# Patient Record
Sex: Female | Born: 1985 | Race: White | Hispanic: No | Marital: Married | Smoking: Former smoker
Health system: Southern US, Community
[De-identification: ages and names within clinical notes are randomized; demographics above are authoritative.]

---

## 2006-10-23 ENCOUNTER — Emergency Department: Payer: Self-pay | Admitting: Emergency Medicine

## 2006-10-23 ENCOUNTER — Other Ambulatory Visit: Payer: Self-pay

## 2010-10-27 ENCOUNTER — Inpatient Hospital Stay: Payer: Self-pay

## 2016-04-13 ENCOUNTER — Emergency Department: Payer: Self-pay

## 2016-04-13 ENCOUNTER — Emergency Department
Admission: EM | Admit: 2016-04-13 | Discharge: 2016-04-13 | Disposition: A | Discharge status: Home / Self Care | Payer: Self-pay | Attending: Emergency Medicine | Admitting: Emergency Medicine

## 2016-04-13 DIAGNOSIS — Z87891 Personal history of nicotine dependence: Secondary | ICD-10-CM | POA: Insufficient documentation

## 2016-04-13 DIAGNOSIS — Z79899 Other long term (current) drug therapy: Secondary | ICD-10-CM | POA: Insufficient documentation

## 2016-04-13 DIAGNOSIS — N939 Abnormal uterine and vaginal bleeding, unspecified: Secondary | ICD-10-CM

## 2016-04-13 DIAGNOSIS — Z3A08 8 weeks gestation of pregnancy: Secondary | ICD-10-CM | POA: Insufficient documentation

## 2016-04-13 DIAGNOSIS — O200 Threatened abortion: Secondary | ICD-10-CM | POA: Insufficient documentation

## 2016-04-13 LAB — COMPREHENSIVE METABOLIC PANEL
ALBUMIN: 4.1 g/dL (ref 3.5–5.0)
ALK PHOS: 66 U/L (ref 38–126)
ALT: 76 U/L — AB (ref 14–54)
AST: 58 U/L — AB (ref 15–41)
Anion gap: 8 (ref 5–15)
BUN: 9 mg/dL (ref 6–20)
CHLORIDE: 107 mmol/L (ref 101–111)
CO2: 21 mmol/L — AB (ref 22–32)
CREATININE: 0.7 mg/dL (ref 0.44–1.00)
Calcium: 8.7 mg/dL — ABNORMAL LOW (ref 8.9–10.3)
GFR calc non Af Amer: >60 mL/min (ref >60)
GLUCOSE: 107 mg/dL — AB (ref 65–99)
Potassium: 3.4 mmol/L — ABNORMAL LOW (ref 3.5–5.1)
SODIUM: 136 mmol/L (ref 135–145)
Total Bilirubin: 0.5 mg/dL (ref 0.3–1.2)
Total Protein: 7.3 g/dL (ref 6.5–8.1)

## 2016-04-13 LAB — ABO/RH: ABO/RH(D): O POS

## 2016-04-13 LAB — CBC WITH DIFFERENTIAL/PLATELET
Basophils Absolute: 0 10*3/uL (ref 0–0.1)
Basophils Relative: 0 %
EOS ABS: 0 10*3/uL (ref 0–0.7)
Eosinophils Relative: 0 %
HEMATOCRIT: 39 % (ref 35.0–47.0)
HEMOGLOBIN: 13.7 g/dL (ref 12.0–16.0)
LYMPHS ABS: 1.3 10*3/uL (ref 1.0–3.6)
Lymphocytes Relative: 23 %
MCH: 30.6 pg (ref 26.0–34.0)
MCHC: 35.1 g/dL (ref 32.0–36.0)
MCV: 86.9 fL (ref 80.0–100.0)
MONO ABS: 0.4 10*3/uL (ref 0.2–0.9)
MONOS PCT: 8 %
NEUTROS PCT: 69 %
Neutro Abs: 3.7 10*3/uL (ref 1.4–6.5)
Platelets: 222 10*3/uL (ref 150–440)
RBC: 4.49 MIL/uL (ref 3.80–5.20)
RDW: 12.1 % (ref 11.5–14.5)
WBC: 5.4 10*3/uL (ref 3.6–11.0)

## 2016-04-13 LAB — HCG, QUANTITATIVE, PREGNANCY: HCG, BETA CHAIN, QUANT, S: 108994 m[IU]/mL — AB (ref <5)

## 2016-04-13 NOTE — ED Notes (Signed)
Patient transported to Ultrasound 

## 2016-04-13 NOTE — ED Provider Notes (Signed)
[facility] Emergency Department Provider Note   ____________________________________________   First MD Initiated Contact with Patient 04/13/16 0920     (approximate)  I have reviewed the triage vital signs and the nursing notes.   HISTORY  Chief Complaint Vaginal Bleeding    HPI Kathryn Lowe is a 30 y.o. female who reports she is about [redacted] weeks pregnant. She has been spotting for the last couple days worse today. There is no pain associated with it. This is her second pregnancy she had no spotting with the first. She says she is slightly lightheaded but not much. Blood was dripping into the commode earlier.   History reviewed. No pertinent past medical history.  There are no active problems to display for this patient.   History reviewed. No pertinent surgical history.  Prior to Admission medications   Medication Sig Start Date End Date Taking? Authorizing Provider  Prenatal Vit-Fe Fumarate-FA (PRENATAL MULTIVITAMIN) TABS tablet Take 1 tablet by mouth daily at 12 noon.   Yes Historical Provider, MD    Allergies Patient has no known allergies.  No family history on file.  Social History Social History  Substance Use Topics  . Smoking status: Former Games developermoker  . Smokeless tobacco: Never Used  . Alcohol use No    Review of Systems Constitutional: No fever/chills Eyes: No visual changes. ENT: No sore throat. Cardiovascular: Denies chest pain. Respiratory: Denies shortness of breath. Gastrointestinal: No abdominal pain.  No nausea, no vomiting.  No diarrhea.  No constipation. Genitourinary: Negative for dysuria. Musculoskeletal: Negative for back pain. Skin: Negative for rash.  10-point ROS otherwise negative.  ____________________________________________   PHYSICAL EXAM:  VITAL SIGNS: ED Triage Vitals [04/13/16 0913]  Enc Vitals Group     BP 126/68     Pulse Rate (!) 109     Resp 18     Temp 98.4 F (36.9 C)     Temp  Source Oral     SpO2 100 %     Weight 140 lb (63.5 kg)     Height 5\' 6"  (1.676 m)     Head Circumference      Peak Flow      Pain Score 0     Pain Loc      Pain Edu?      Excl. in GC?     Constitutional: Alert and oriented.Patient reports she is very anxious and is actually shaking. Eyes: Conjunctivae are normal. PERRL. EOMI. Head: Atraumatic. Nose: No congestion/rhinnorhea. Mouth/Throat: Mucous membranes are moist.  Oropharynx non-erythematous. Neck: No stridor. Cardiovascular: Normal rate, regular rhythm. Grossly normal heart sounds.  Good peripheral circulation. Respiratory: Normal respiratory effort.  No retractions. Lungs CTAB. Gastrointestinal: Soft and nontender. No distention. No abdominal bruits. No CVA tenderness. {**Genitourinary:  Musculoskeletal: No lower extremity tenderness nor edema.  No joint effusions. Neurologic:  Normal speech and language. No gross focal neurologic deficits are appreciated. No gait instability. Skin:  Skin is warm, dry and intact. No rash noted.   ____________________________________________   LABS (all labs ordered are listed, but only abnormal results are displayed)  Labs Reviewed  COMPREHENSIVE METABOLIC PANEL - Abnormal; Notable for the following:       Result Value   Potassium 3.4 (*)    CO2 21 (*)    Glucose, Bld 107 (*)    Calcium 8.7 (*)    AST 58 (*)    ALT 76 (*)    All other components within normal limits  HCG,  QUANTITATIVE, PREGNANCY - Abnormal; Notable for the following:    hCG, Beta Chain, Quant, [place] [phone] (*)    All other components within normal limits  CBC WITH DIFFERENTIAL/PLATELET  ABO/RH   ____________________________________________  EKG   ____________________________________________  RADIOLOGY  Study Result   CLINICAL DATA:  First trimester pregnancy with increasing vaginal bleeding. LMP 02/04/2016. Beta HCG level 108,994.  EXAM: OBSTETRIC <14 WK US AND TRANSVAGINAL OB US  TECHNIQUE: Both  transabdominal and transvaginal ultrasound examinations were performed for complete evaluation of the gestation as well as the maternal uterus, adnexal regions, and pelvic cul-de-sac. Transvaginal technique was performed to assess early pregnancy.  COMPARISON:  None.  FINDINGS: Intrauterine gestational sac: Visualized/normal in shape.  Yolk sac:  Visualized.  Embryo:  Visualized.  Cardiac Activity: Visualized.  Heart Rate: 162  bpm  CRL:  15.1  mm; 7 w 6 d;                  US EDC: 11/24/2016  Subchorionic hemorrhage: None. Unfused amnion noted with some surrounding echogenic debris.  Maternal uterus/adnexae: Both maternal ovaries are visualized. Corpus luteum noted on the right. No adnexal mass or significant free pelvic fluid.  IMPRESSION: 1. Single live intrauterine pregnancy with best estimated gestational age of [redacted] weeks 6 days. 2. Unfused amnion with some surrounding echogenic debris, but no significant subchorionic hematoma.   Electronically Signed   By: Carey BullocksWilliam  Veazey M.D.   On: 04/13/2016 12:15     ____________________________________________   PROCEDURES  Procedure(s) performed:   Procedures  Critical Care performed:   ____________________________________________   INITIAL IMPRESSION / ASSESSMENT AND PLAN / ED COURSE  Pertinent labs & imaging results that were available during my care of the patient were reviewed by me and considered in my medical decision making (see chart for details).    Clinical Course   Patient declines pelvic exam. She is not having any pain ultrasound looks good and she will follow-up in the next couple days she says she will get GYN to do that.   ____________________________________________   FINAL CLINICAL IMPRESSION(S) / ED DIAGNOSES  Final diagnoses:  Threatened miscarriage      NEW MEDICATIONS STARTED DURING THIS VISIT:  New Prescriptions   No medications on file     Note:  This  document was prepared using Dragon voice recognition software and may include unintentional dictation errors.    Arnaldo NatalPaul F Malinda, MD 04/13/16 [phone]

## 2016-04-13 NOTE — ED Notes (Signed)
Patient reports some spotting. Bleeding heavy yesterday, last night stained underwear. Not as heavy today. No cramps or clots.

## 2016-04-13 NOTE — ED Notes (Signed)
Pt informed to return if any life threatening symptoms occur.  

## 2016-04-13 NOTE — ED Triage Notes (Signed)
Pt states she is [redacted]weeks pregnant and has been spotting for the past few days , denies pain

## 2016-04-13 NOTE — Discharge Instructions (Signed)
Please return for bad cramping or heavy bleeding or if you get lightheaded. Please call either [facility] or Dr[facility]les[facility]ard [facility] Let them know you were in the ER with bleeding and her [redacted] weeks pregnant. They will likely see within the next 2 days for follow-up.

## 2016-05-07 ENCOUNTER — Emergency Department
Admission: EM | Admit: 2016-05-07 | Discharge: 2016-05-07 | Disposition: A | Discharge status: Home / Self Care | Payer: Self-pay | Attending: Emergency Medicine | Admitting: Emergency Medicine

## 2016-05-07 ENCOUNTER — Encounter: Payer: Self-pay | Admitting: Emergency Medicine

## 2016-05-07 DIAGNOSIS — Z87891 Personal history of nicotine dependence: Secondary | ICD-10-CM | POA: Insufficient documentation

## 2016-05-07 DIAGNOSIS — Z3A11 11 weeks gestation of pregnancy: Secondary | ICD-10-CM | POA: Insufficient documentation

## 2016-05-07 DIAGNOSIS — O200 Threatened abortion: Secondary | ICD-10-CM | POA: Insufficient documentation

## 2016-05-07 DIAGNOSIS — Z79899 Other long term (current) drug therapy: Secondary | ICD-10-CM | POA: Insufficient documentation

## 2016-05-07 LAB — CBC WITH DIFFERENTIAL/PLATELET
BASOS ABS: 0 10*3/uL (ref 0–0.1)
Basophils Relative: 1 %
EOS PCT: 1 %
Eosinophils Absolute: 0 10*3/uL (ref 0–0.7)
HCT: 40.4 % (ref 35.0–47.0)
Hemoglobin: 14.2 g/dL (ref 12.0–16.0)
LYMPHS PCT: 18 %
Lymphs Abs: 1.6 10*3/uL (ref 1.0–3.6)
MCH: 30.5 pg (ref 26.0–34.0)
MCHC: 35.2 g/dL (ref 32.0–36.0)
MCV: 86.7 fL (ref 80.0–100.0)
Monocytes Absolute: 0.6 10*3/uL (ref 0.2–0.9)
Monocytes Relative: 7 %
NEUTROS ABS: 6.9 10*3/uL — AB (ref 1.4–6.5)
NEUTROS PCT: 75 %
PLATELETS: 249 10*3/uL (ref 150–440)
RBC: 4.66 MIL/uL (ref 3.80–5.20)
RDW: 11.7 % (ref 11.5–14.5)
WBC: 9.2 10*3/uL (ref 3.6–11.0)

## 2016-05-07 LAB — HCG, QUANTITATIVE, PREGNANCY: HCG, BETA CHAIN, QUANT, S: 55441 m[IU]/mL — AB (ref <5)

## 2016-05-07 NOTE — ED Provider Notes (Signed)
[facility] Emergency Department Provider Note  ____________________________________________   I have reviewed the triage vital signs and the nursing notes.   HISTORY  Chief Complaint Abdominal Pain and Vaginal Bleeding    HPI Kathryn Lowe is a 30 y.o. female with a known IUP who is G2 P1, has a prior ultrasound which shows an intrauterine pregnancy, presents today with increased bleeding. About the level of.. Starts to having some cramping as well. She is a partially [redacted] weeks pregnant. Patient here for that reason. She wants to see if she is having a miscarriage. She denies any clots or passage of tissue and she is not having significant pain at this time.  She is not lightheaded and has no history of easy bleeding.  History reviewed. No pertinent past medical history.  There are no active problems to display for this patient.   History reviewed. No pertinent surgical history.  Prior to Admission medications   Medication Sig Start Date End Date Taking? Authorizing Provider  Prenatal Vit-Fe Fumarate-FA (PRENATAL MULTIVITAMIN) TABS tablet Take 1 tablet by mouth daily at 12 noon.   Yes Historical Provider, MD    Allergies Patient has no known allergies.  No family history on file.  Social History Social History  Substance Use Topics  . Smoking status: Former Games developermoker  . Smokeless tobacco: Never Used  . Alcohol use No    Review of Systems }Constitutional: No fever/chills Eyes: No visual changes. ENT: No sore throat. No stiff neck no neck pain Cardiovascular: Denies chest pain. Respiratory: Denies shortness of breath. Gastrointestinal:   no vomiting.  No diarrhea.  No constipation. Genitourinary: Negative for dysuria. Musculoskeletal: Negative lower extremity swelling Skin: Negative for rash. Neurological: Negative for severe headaches, focal weakness or numbness. 10-point ROS otherwise  negative.  ____________________________________________   PHYSICAL EXAM:  VITAL SIGNS: ED Triage Vitals  Enc Vitals Group     BP 05/07/16 0758 125/71     Pulse Rate 05/07/16 0758 83     Resp 05/07/16 0758 18     Temp 05/07/16 0758 97.6 F (36.4 C)     Temp Source 05/07/16 0758 Oral     SpO2 05/07/16 0758 99 %     Weight 05/07/16 0758 140 lb (63.5 kg)     Height 05/07/16 0758 5\' 6"  (1.676 m)     Head Circumference --      Peak Flow --      Pain Score 05/07/16 0759 5     Pain Loc --      Pain Edu? --      Excl. in GC? --     Constitutional: Alert and oriented. Well appearing and in no acute distress. Eyes: Conjunctivae are normal. PERRL. EOMI. Head: Atraumatic. Nose: No congestion/rhinnorhea. Mouth/Throat: Mucous membranes are moist.  Oropharynx non-erythematous. Neck: No stridor.   Nontender with no meningismus Cardiovascular: Normal rate, regular rhythm. Grossly normal heart sounds.  Good peripheral circulation. Respiratory: Normal respiratory effort.  No retractions. Lungs CTAB. Abdominal: Soft and nontender. No distention. No guarding no rebound Back:  There is no focal tenderness or step off.  there is no midline tenderness there are no lesions noted. there is no CVA tenderness GU: Patient declined Musculoskeletal: No lower extremity tenderness, no upper extremity tenderness. No joint effusions, no DVT signs strong distal pulses no edema Neurologic:  Normal speech and language. No gross focal neurologic deficits are appreciated.  Skin:  Skin is warm, dry and intact. No rash noted. Psychiatric: Mood  and affect are normal. Speech and behavior are normal.  ____________________________________________   LABS (all labs ordered are listed, but only abnormal results are displayed)  Labs Reviewed  HCG, QUANTITATIVE, PREGNANCY - Abnormal; Notable for the following:       Result Value   hCG, Beta Chain, Quant, S 55,441 (*)    All other components within normal limits   CBC WITH DIFFERENTIAL/PLATELET - Abnormal; Notable for the following:    Neutro Abs 6.9 (*)    All other components within normal limits   ____________________________________________  EKG  I personally interpreted any EKGs ordered by me or triage  ____________________________________________  RADIOLOGY  I reviewed any imaging ordered by me or triage that were performed during my shift and, if possible, patient and/or family made aware of any abnormal findings. ____________________________________________   PROCEDURES  Procedure(s) performed: None  Procedures  Critical Care performed: None  ____________________________________________   INITIAL IMPRESSION / ASSESSMENT AND PLAN / ED COURSE  Pertinent labs & imaging results that were available during my care of the patient were reviewed by me and considered in my medical decision making (see chart for details).  Patient here with menstrual period level bleeding in early pregnancy. She is known oh positive, she has known IUP, her abdomen is benign. I am unable to find fetal heart tones. I did explain to her the likelihood of a miscarriage. I discussed with Dr. Jean RosenthalJackson, who would prefer having an ultrasound if possible, I talked to the family and they at this time do not wish to do so. They are self pay, and is certainly the case and I'll show is not likely to radically change management at this moment. They would therefore prefer to defer ultrasound. They know that this means I cannot tell them the status of the pregnancy and they're very comfortable with the limitations is voices upon them. I did attempt bedside ultrasound I do not see any definitive IUP, but they understand I'm not an expert at this and in order to know we have to do a regular ultrasound. In addition, patient at this time states that she would prefer not to have a pelvic exam she would rather just go home. Again this is certainly her choice but it limits my ability  to evaluate her condition and she understands this. Extensive return precautions and follow-up given and understood. Patient is to follow-up as an outpatient with a provider and I have called the social worker that is working with her to see if they can advance that appointment. I'll also refer her to Dr. Jean RosenthalJackson. Patient very comfortable with this plan. Patient and husband both understand the risks benefits and alternative declining ultrasound and declining pelvic exam. Both of them understand return precautions.  Clinical Course    ____________________________________________   FINAL CLINICAL IMPRESSION(S) / ED DIAGNOSES  Final diagnoses:  None      This chart was dictated using voice recognition software.  Despite best efforts to proofread,  errors can occur which can change meaning.     Jeanmarie PlantJames A Remmy Crass, MD 05/07/16 1025

## 2016-05-07 NOTE — ED Notes (Signed)
Reports being [redacted] wks pregnant. Spotting throughout pregnancy.  Last night started having heavier bleeding and cramping. Skin w/d with good color. Gravida 2, no issues with previous pregnancy.

## 2016-05-07 NOTE — ED Notes (Signed)
Upon standing to get dressed pt passed several clots.  Pt given clean garments.  Skin w/d with good color.  Pt tearful, husband supportive at bedside.

## 2016-05-07 NOTE — ED Triage Notes (Signed)
Pt states is 11 weeks 2 days pregnant, G2L1. Pt states was seen here 3 weeks ago for cramping and spotting. Pt states was told "everything was fine" when seen. Pt states approx 2 days ago began having light spotting, cramping started last night and has progressively worsened as well as spotting has progressed to being "like a period". Pt reports using 1 pad since last night.

## 2016-05-07 NOTE — ED Notes (Signed)
Pt states the IV is uncomfortable and request it be taken out.  IV removed, site intact.

## 2016-05-28 ENCOUNTER — Encounter: Payer: Self-pay | Admitting: Obstetrics and Gynecology

## 2017-03-18 ENCOUNTER — Encounter: Payer: Self-pay | Admitting: Emergency Medicine

## 2017-03-18 ENCOUNTER — Emergency Department: Payer: Self-pay

## 2017-03-18 ENCOUNTER — Emergency Department
Admission: EM | Admit: 2017-03-18 | Discharge: 2017-03-18 | Disposition: A | Discharge status: Home / Self Care | Payer: Self-pay | Attending: Emergency Medicine | Admitting: Emergency Medicine

## 2017-03-18 DIAGNOSIS — R7401 Elevation of levels of liver transaminase levels: Secondary | ICD-10-CM

## 2017-03-18 DIAGNOSIS — R740 Nonspecific elevation of levels of transaminase and lactic acid dehydrogenase [LDH]: Secondary | ICD-10-CM | POA: Insufficient documentation

## 2017-03-18 DIAGNOSIS — Z87891 Personal history of nicotine dependence: Secondary | ICD-10-CM | POA: Insufficient documentation

## 2017-03-18 DIAGNOSIS — I889 Nonspecific lymphadenitis, unspecified: Secondary | ICD-10-CM | POA: Insufficient documentation

## 2017-03-18 LAB — URINALYSIS, COMPLETE (UACMP) WITH MICROSCOPIC
BACTERIA UA: NONE SEEN
BILIRUBIN URINE: NEGATIVE
Glucose, UA: NEGATIVE mg/dL
Ketones, ur: NEGATIVE mg/dL
Leukocytes, UA: NEGATIVE
Nitrite: NEGATIVE
PROTEIN: NEGATIVE mg/dL
SPECIFIC GRAVITY, URINE: 1.004 — AB (ref 1.005–1.030)
pH: 7 (ref 5.0–8.0)

## 2017-03-18 LAB — CBC WITH DIFFERENTIAL/PLATELET
Basophils Absolute: 0 10*3/uL (ref 0–0.1)
Basophils Relative: 1 %
Eosinophils Absolute: 0 10*3/uL (ref 0–0.7)
Eosinophils Relative: 0 %
HEMATOCRIT: 36.9 % (ref 35.0–47.0)
HEMOGLOBIN: 12.4 g/dL (ref 12.0–16.0)
LYMPHS ABS: 0.6 10*3/uL — AB (ref 1.0–3.6)
LYMPHS PCT: 11 %
MCH: 28.3 pg (ref 26.0–34.0)
MCHC: 33.7 g/dL (ref 32.0–36.0)
MCV: 84 fL (ref 80.0–100.0)
MONOS PCT: 8 %
Monocytes Absolute: 0.5 10*3/uL (ref 0.2–0.9)
NEUTROS ABS: 4.9 10*3/uL (ref 1.4–6.5)
Neutrophils Relative %: 80 %
Platelets: 280 10*3/uL (ref 150–440)
RBC: 4.39 MIL/uL (ref 3.80–5.20)
RDW: 12.8 % (ref 11.5–14.5)
WBC: 6 10*3/uL (ref 3.6–11.0)

## 2017-03-18 LAB — LACTIC ACID, PLASMA: LACTIC ACID, VENOUS: 1.3 mmol/L (ref 0.5–1.9)

## 2017-03-18 LAB — COMPREHENSIVE METABOLIC PANEL
ALBUMIN: 3.6 g/dL (ref 3.5–5.0)
ALT: 156 U/L — ABNORMAL HIGH (ref 14–54)
ANION GAP: 11 (ref 5–15)
AST: 129 U/L — ABNORMAL HIGH (ref 15–41)
Alkaline Phosphatase: 285 U/L — ABNORMAL HIGH (ref 38–126)
BILIRUBIN TOTAL: 0.6 mg/dL (ref 0.3–1.2)
BUN: <5 mg/dL — ABNORMAL LOW (ref 6–20)
CHLORIDE: 102 mmol/L (ref 101–111)
CO2: 24 mmol/L (ref 22–32)
Calcium: 8.5 mg/dL — ABNORMAL LOW (ref 8.9–10.3)
Creatinine, Ser: 0.61 mg/dL (ref 0.44–1.00)
GFR calc Af Amer: >60 mL/min (ref >60)
GFR calc non Af Amer: >60 mL/min (ref >60)
Glucose, Bld: 102 mg/dL — ABNORMAL HIGH (ref 65–99)
POTASSIUM: 3.2 mmol/L — AB (ref 3.5–5.1)
Sodium: 137 mmol/L (ref 135–145)
TOTAL PROTEIN: 7.4 g/dL (ref 6.5–8.1)

## 2017-03-18 LAB — TSH: TSH: 2.39 u[IU]/mL (ref 0.350–4.500)

## 2017-03-18 LAB — MONONUCLEOSIS SCREEN: MONO SCREEN: NEGATIVE

## 2017-03-18 MED ORDER — OXYCODONE HCL 5 MG PO TABS: 5.0000 mg | ORAL_TABLET | Freq: Three times a day (TID) | ORAL | 0 refills | Status: AC | PRN

## 2017-03-18 MED ORDER — PREDNISONE 50 MG PO TABS: ORAL_TABLET | ORAL | 0 refills | Status: AC

## 2017-03-18 NOTE — ED Triage Notes (Signed)
Patient presents to the ED with swollen and tender neck and tender shoulders.  Patient reports pain with yawning.  Patient states she believes she has had an intermittent fever for the past several days and has felt very tired.  Patient denies pain with swallowing.

## 2017-03-18 NOTE — ED Notes (Signed)
Lumps noted bilaterally on throat/neck. Patient states tender to touch, more pain below region of lumps, painful to swallow and turn head left to right. Patient reports intermittent elevated temperature.

## 2017-03-18 NOTE — ED Provider Notes (Signed)
[facility] Emergency Department Provider Note       Time seen: ----------------------------------------- 7:42 PM on 03/18/2017 -----------------------------------------     I have reviewed the triage vital signs and the nursing notes.   HISTORY   Chief Complaint Neck Pain    HPI Shalla R Memory Argueance is a 31 y.o. female with no significant past medical history who presents to the ED for adenopathy in her neck. Patient presents to ER with swollen and tender region in her neck and shoulders. Patient reports the pain is worse with yawning, she noticed it over the weekend and has progressed until today. She denies any specific pain with swallowing, has had some malaise but no specific fevers.  History reviewed. No pertinent past medical history.  There are no active problems to display for this patient.   History reviewed. No pertinent surgical history.  Allergies Aspirin  Social History Social History  Substance Use Topics  . Smoking status: Former Games developermoker  . Smokeless tobacco: Never Used  . Alcohol use No    Review of Systems Constitutional: Negative for fever. Eyes: Negative for vision changes ENT:  Negative for congestion, sore throat. Positive for neck pain and swollen lymph nodes Cardiovascular: Negative for chest pain. Respiratory: Negative for shortness of breath. Gastrointestinal: Negative for abdominal pain, vomiting and diarrhea. Genitourinary: Negative for dysuria. Musculoskeletal: Negative for back pain. Skin: Negative for rash. Neurological: Negative for headaches, focal weakness or numbness.  All systems negative/normal/unremarkable except as stated in the HPI  ____________________________________________   PHYSICAL EXAM:  VITAL SIGNS: ED Triage Vitals  Enc Vitals Group     BP 03/18/17 1753 112/75     Pulse Rate 03/18/17 1753 (!) 144     Resp 03/18/17 1753 18     Temp 03/18/17 1753 99.5 F (37.5 C)     Temp Source  03/18/17 1753 Oral     SpO2 03/18/17 1753 96 %     Weight 03/18/17 1754 135 lb (61.2 kg)     Height 03/18/17 1754 5\' 6"  (1.676 m)     Head Circumference --      Peak Flow --      Pain Score 03/18/17 1807 5     Pain Loc --      Pain Edu? --      Excl. in GC? --     Constitutional: Alert and oriented. Well appearing and in no distress. Eyes: Conjunctivae are normal. Normal extraocular movements. ENT   Head: Normocephalic and atraumatic.   Nose: No congestion/rhinnorhea.   Mouth/Throat: Mucous membranes are moist.   Neck: No stridor. Marked anterior adenopathy Cardiovascular: Normal rate, regular rhythm. No murmurs, rubs, or gallops. Respiratory: Normal respiratory effort without tachypnea nor retractions. Breath sounds are clear and equal bilaterally. No wheezes/rales/rhonchi. Gastrointestinal: Soft and nontender. Normal bowel sounds. No hepatosplenomegaly Musculoskeletal: Nontender with normal range of motion in extremities. No lower extremity tenderness nor edema. Neurologic:  Normal speech and language. No gross focal neurologic deficits are appreciated.  Skin:  Skin is warm, dry and intact. No rash noted. Psychiatric: Mood and affect are normal. Speech and behavior are normal.  ____________________________________________  EKG: Interpreted by me. Sinus tachycardia with a rate of 145 bpm, normal PR interval, normal QRS, normal QT.  ____________________________________________  ED COURSE:  Pertinent labs & imaging results that were available during my care of the patient were reviewed by me and considered in my medical decision making (see chart for details). Patient presents for adenopathy, we will assess  with labs and imaging as indicated.   Procedures ____________________________________________   LABS (pertinent positives/negatives)  Labs Reviewed  COMPREHENSIVE METABOLIC PANEL - Abnormal; Notable for the following:       Result Value   Potassium 3.2 (*)     Glucose, Bld 102 (*)    BUN <5 (*)    Calcium 8.5 (*)    AST 129 (*)    ALT 156 (*)    Alkaline Phosphatase 285 (*)    All other components within normal limits  CBC WITH DIFFERENTIAL/PLATELET - Abnormal; Notable for the following:    Lymphs Abs 0.6 (*)    All other components within normal limits  URINALYSIS, COMPLETE (UACMP) WITH MICROSCOPIC - Abnormal; Notable for the following:    Color, Urine STRAW (*)    APPearance CLEAR (*)    Specific Gravity, Urine 1.004 (*)    Hgb urine dipstick MODERATE (*)    Squamous Epithelial / LPF 0-5 (*)    All other components within normal limits  LACTIC ACID, PLASMA  MONONUCLEOSIS SCREEN  TSH  LACTIC ACID, PLASMA  HEPATITIS PANEL, ACUTE    RADIOLOGY Images were viewed by me  Right upper quadrant ultrasound IMPRESSION: Cholelithiasis without sonographic evidence of acute cholecystitis. Mild gallbladder wall thickening likely due to partial decompression. ____________________________________________  DIFFERENTIAL DIAGNOSIS   Hepatitis, mononucleosis, viral syndrome, sepsis, biliary colic   FINAL ASSESSMENT AND PLAN  Cervical lymphadenopathy, transaminitis   Plan: Patient had presented for enlarged lymph nodes. Patients labs did reveal elevation in her liver function tests of uncertain etiology.. Patients imaging revealed gallstones but no other abnormality. Likely her symptoms are viral related. I will prescribe some pain medicine as well as steroids and she just needs close outpatient follow-up with her doctor. I did send off a hepatitis panel.   Emily Filbert, MD   Note: This note was generated in part or whole with voice recognition software. Voice recognition is usually quite accurate but there are transcription errors that can and very often do occur. I apologize for any typographical errors that were not detected and corrected.     Emily Filbert, MD 03/18/17 2104

## 2017-03-20 LAB — HEPATITIS PANEL, ACUTE
HEP A IGM: NEGATIVE
Hep B C IgM: NEGATIVE
Hepatitis B Surface Ag: NEGATIVE

## 2018-03-27 IMAGING — US US OB COMP LESS 14 WK
1 series · 14 of 28 positions shown · non-contrast
Comparison: None.

CLINICAL DATA: First trimester pregnancy with increasing vaginal
bleeding. LMP 02/04/2016. Beta HCG level [DATE].

EXAM:
OBSTETRIC <14 WK US AND TRANSVAGINAL OB US
TECHNIQUE: Both transabdominal and transvaginal ultrasound examinations were
performed for complete evaluation of the gestation as well as the
maternal uterus, adnexal regions, and pelvic cul-de-sac.
Transvaginal technique was performed to assess early pregnancy.

[Series 1: us ob comp less 14 wk · 0.14mm/px · 14 of 125 slices shown]
[im 5/125]
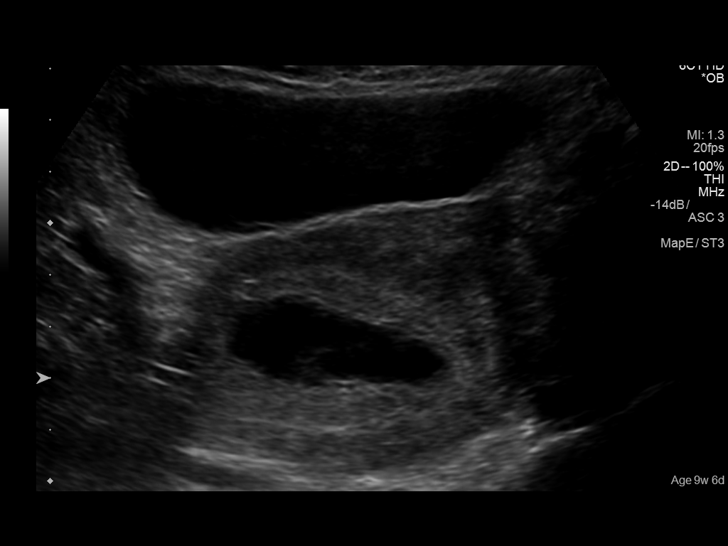
[im 14/125]
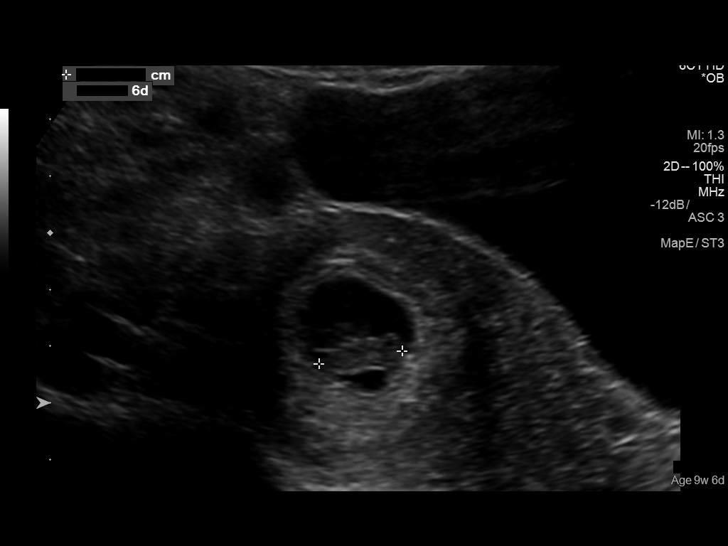
[im 23/125]
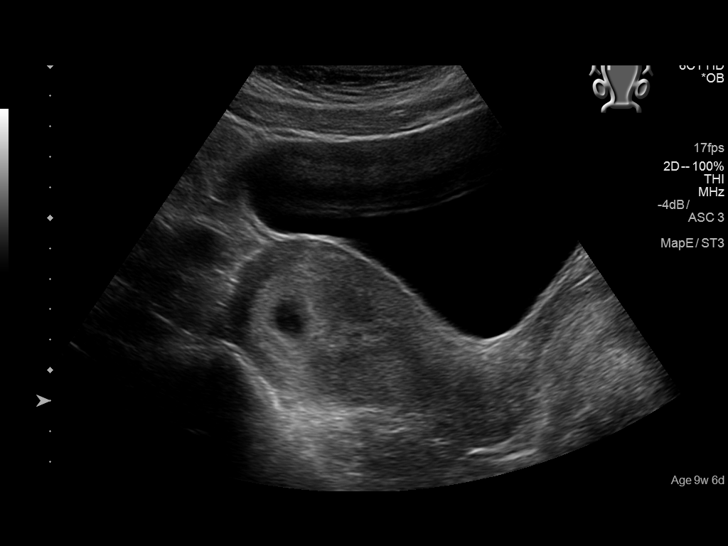
[im 33/125]
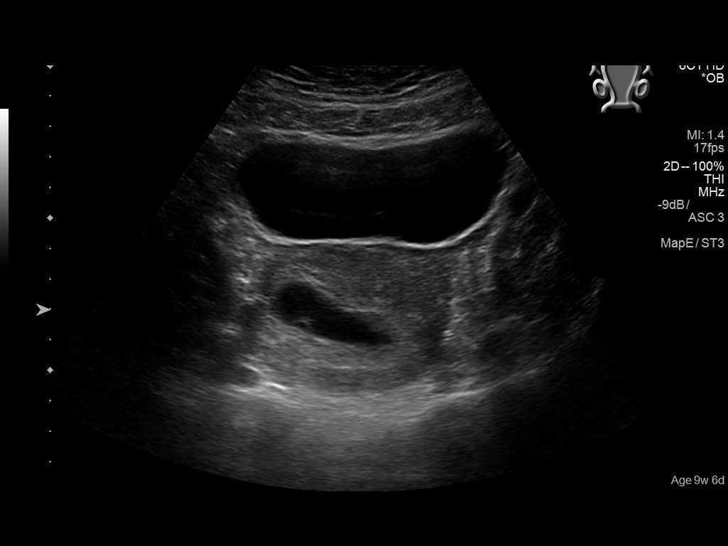
[im 42/125]
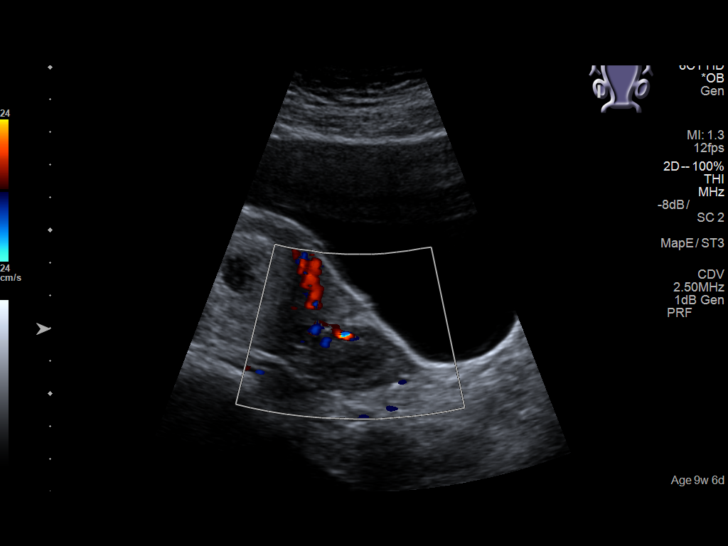
[im 51/125]
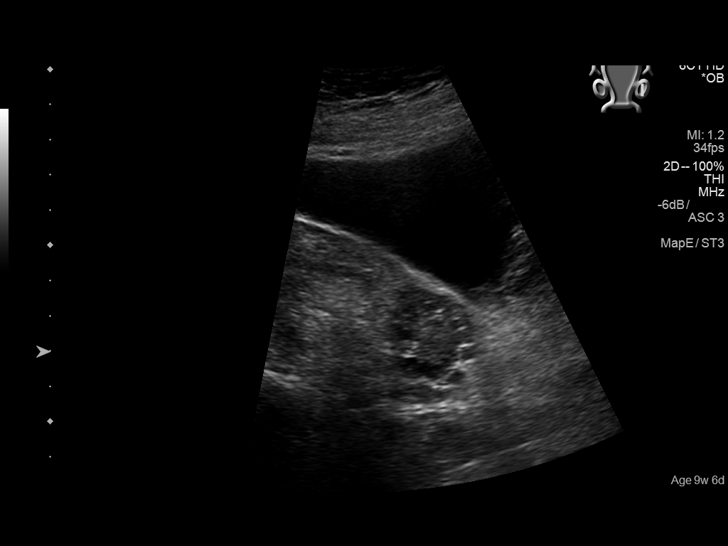
[im 60/125]
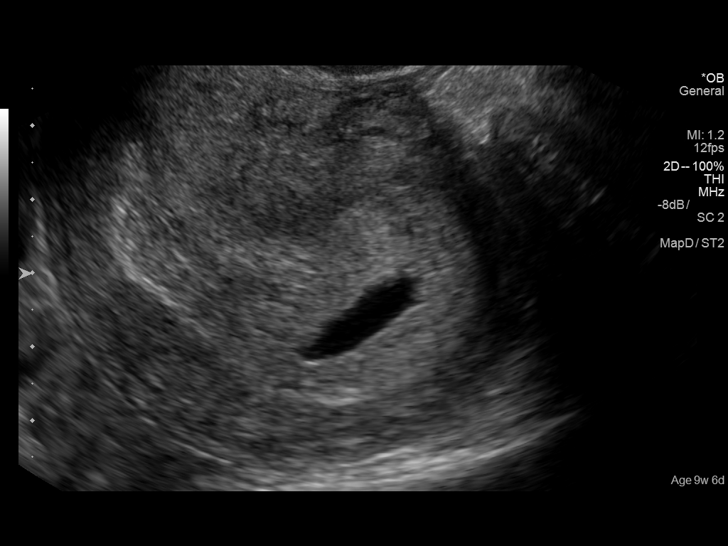
[im 69/125]
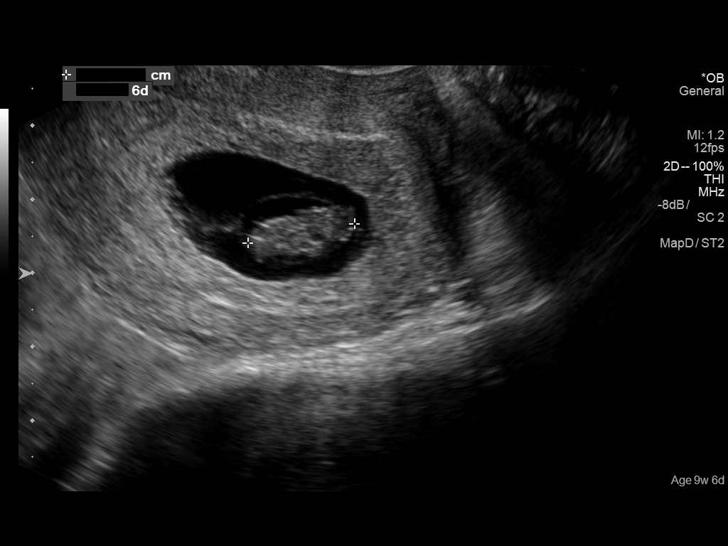
[im 79/125]
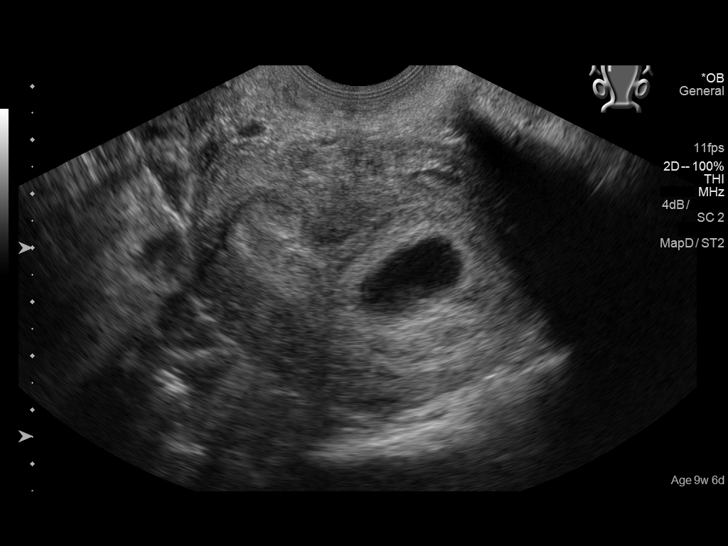
[im 88/125]
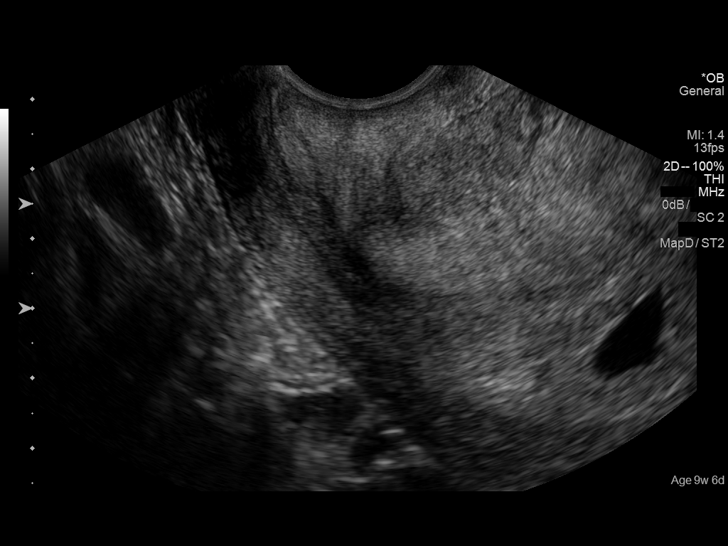
[im 97/125]
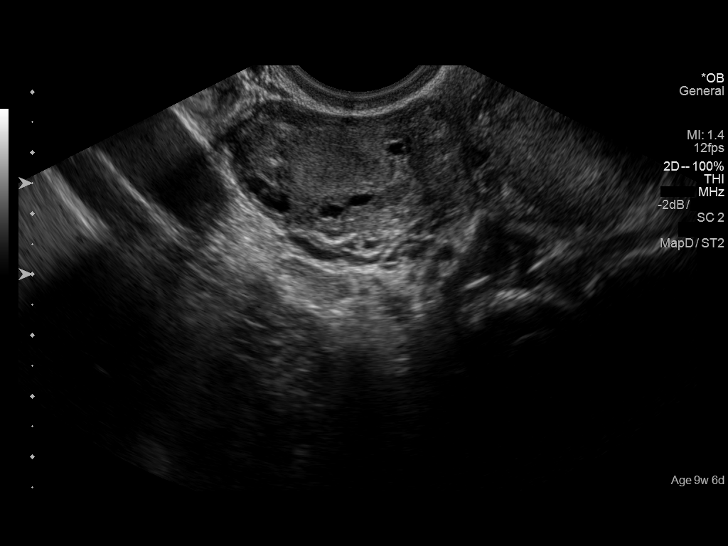
[im 106/125]
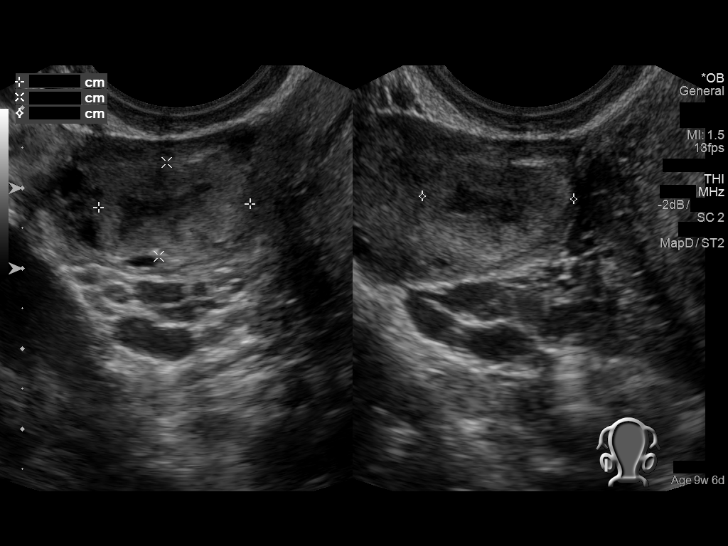
[im 115/125]
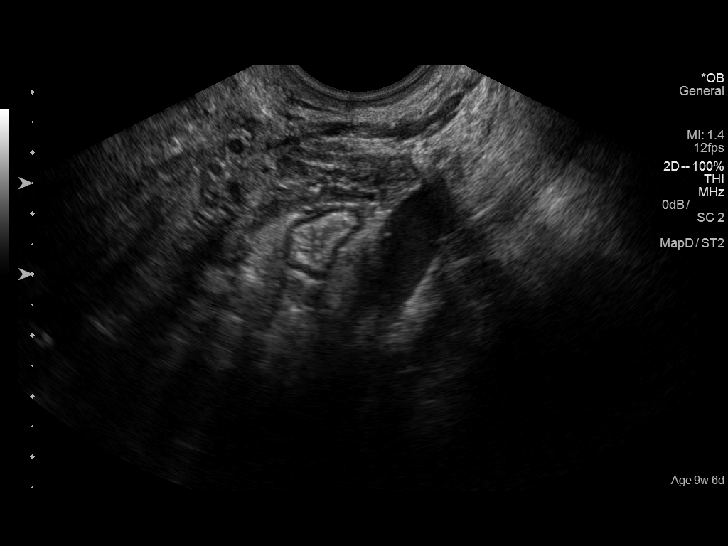
[im 125/125]
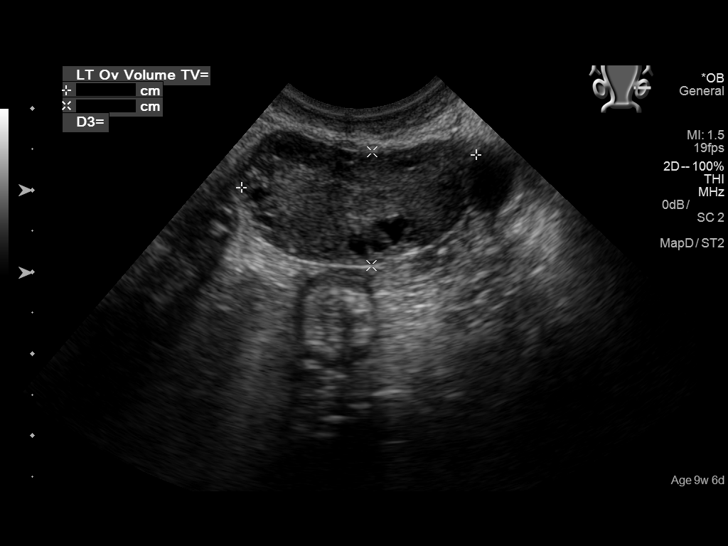

[14 of 28 positions shown; findings below may reference images not displayed]

FINDINGS: Intrauterine gestational sac: Visualized/normal in shape.

Yolk sac:  Visualized.

Embryo:  Visualized.

Cardiac Activity: Visualized.

Heart Rate: 162  bpm

CRL:  15.1  mm; 7 w 6 d;                  US EDC: 11/24/2016

Subchorionic hemorrhage: None. Unfused amnion noted with some
surrounding echogenic debris.

Maternal uterus/adnexae: Both maternal ovaries are visualized.
Corpus luteum noted on the right. No adnexal mass or significant
free pelvic fluid.
IMPRESSION: 1. Single live intrauterine pregnancy with best estimated
gestational age of 7 weeks 6 days.
2. Unfused amnion with some surrounding echogenic debris, but no
significant subchorionic hematoma.

## 2019-03-01 IMAGING — US US ABDOMEN LIMITED
1 series · 14 of 25 positions shown · non-contrast
Comparison: None.

CLINICAL DATA: Transaminitis.

EXAM:
ULTRASOUND ABDOMEN LIMITED RIGHT UPPER QUADRANT

[Series 1: us abdomen limited · 0.17mm/px · 14 of 44 slices shown]
[im 1/44]
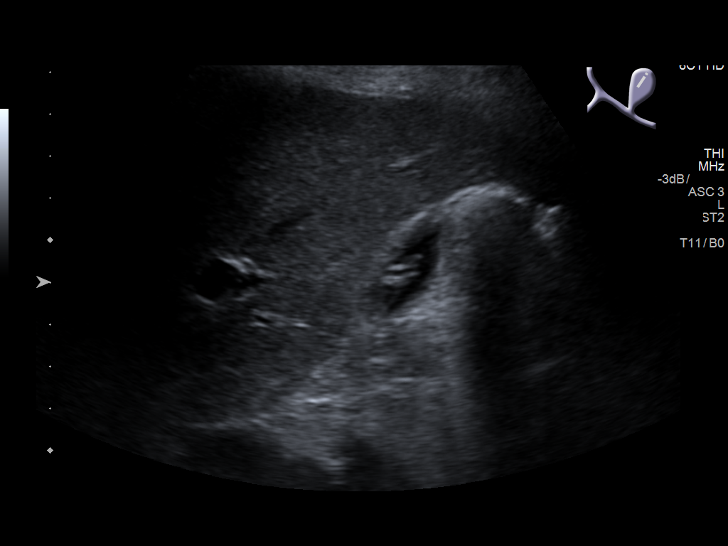
[im 4/44]
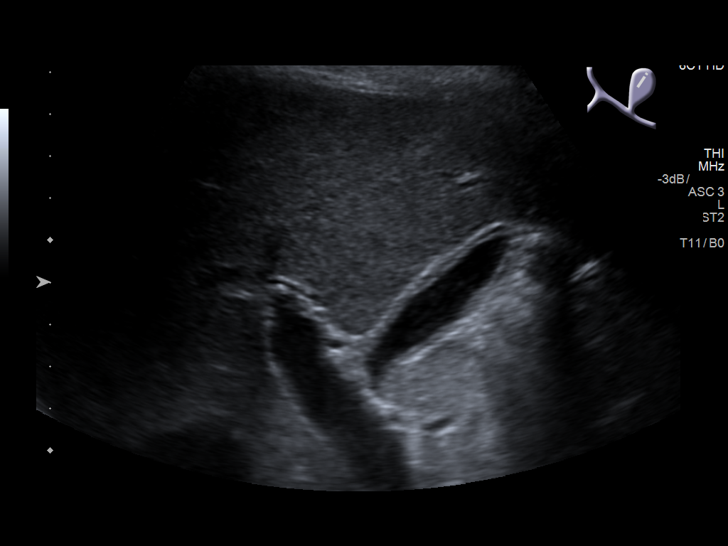
[im 8/44]
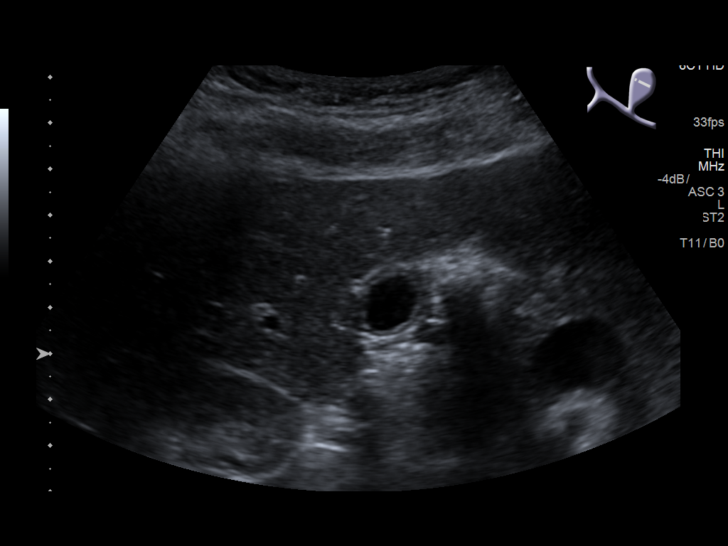
[im 11/44]
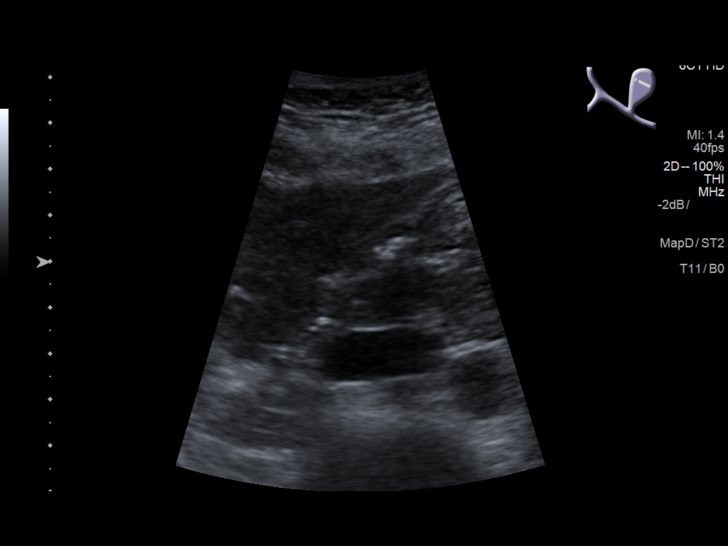
[im 15/44]
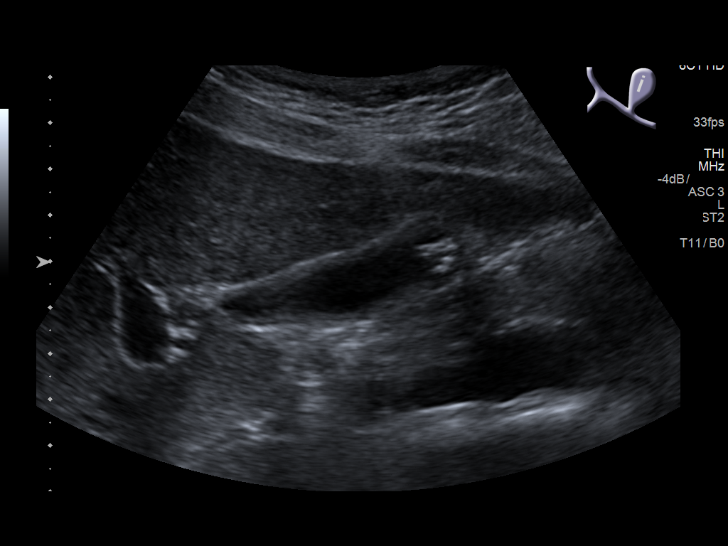
[im 17/44]
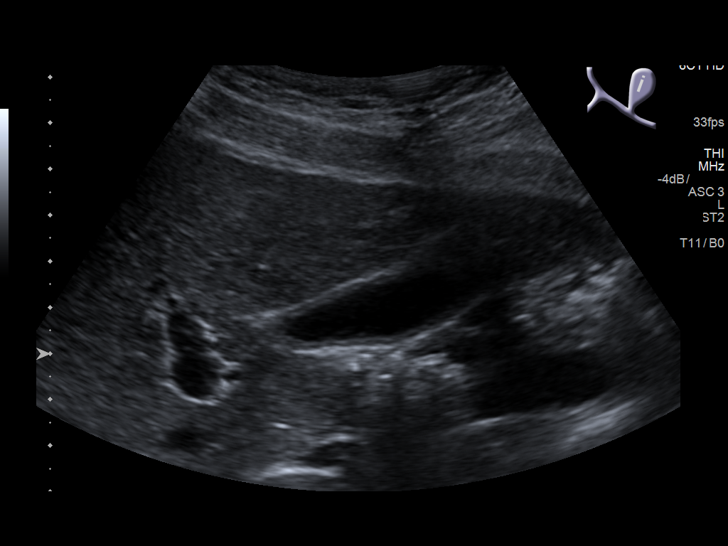
[im 20/44]
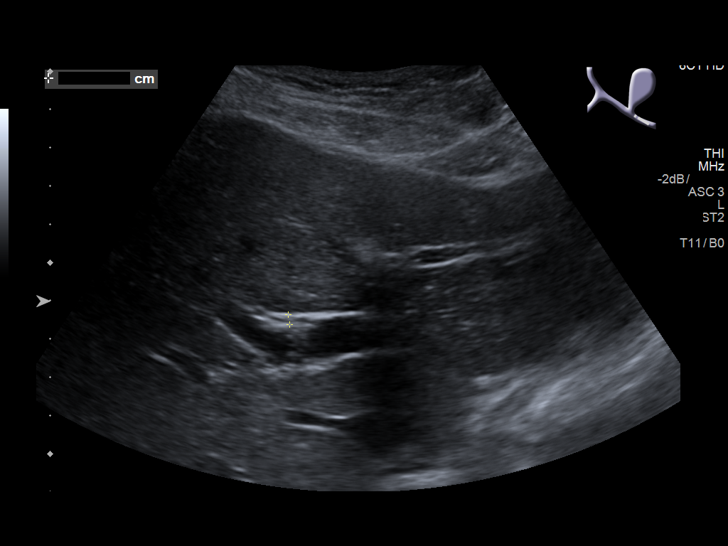
[im 24/44]
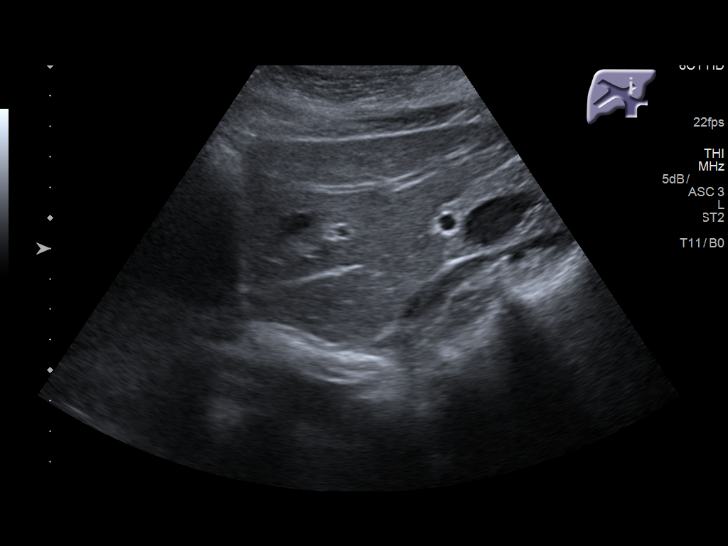
[im 27/44]
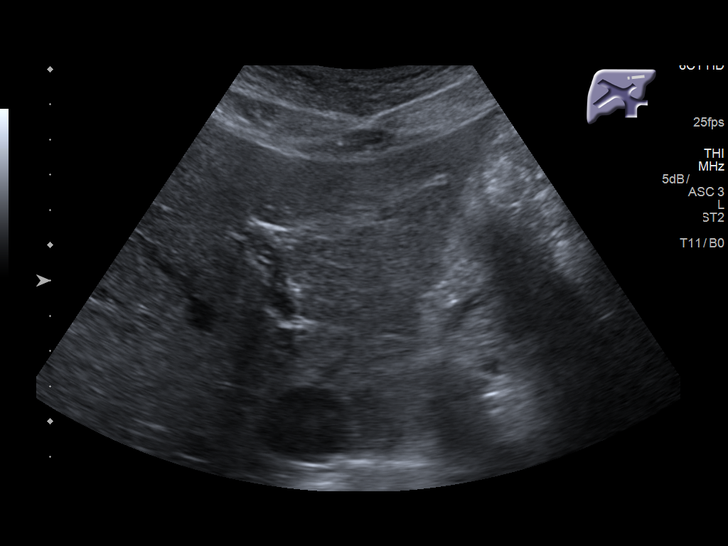
[im 29/44]
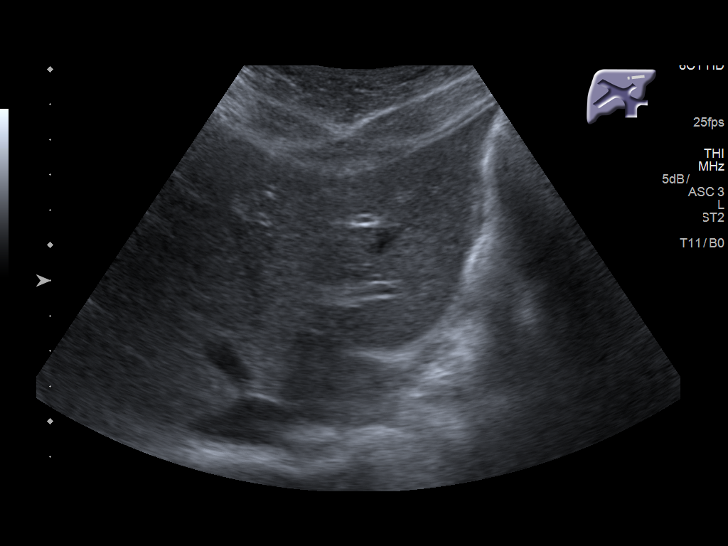
[im 33/44]
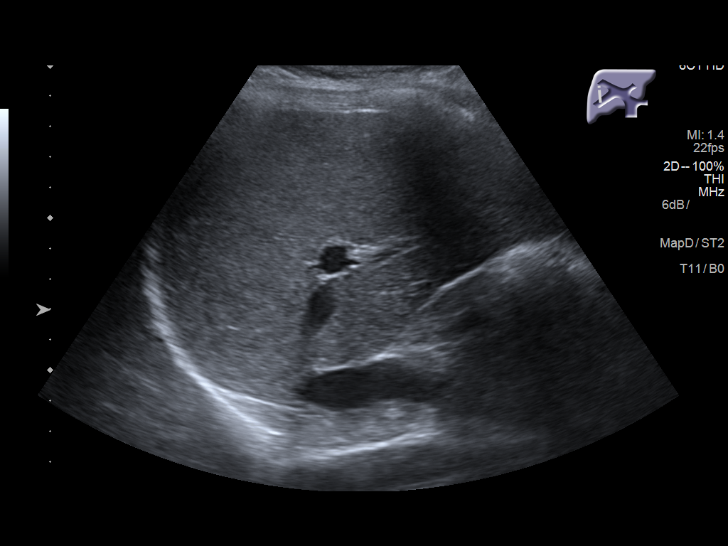
[im 36/44]
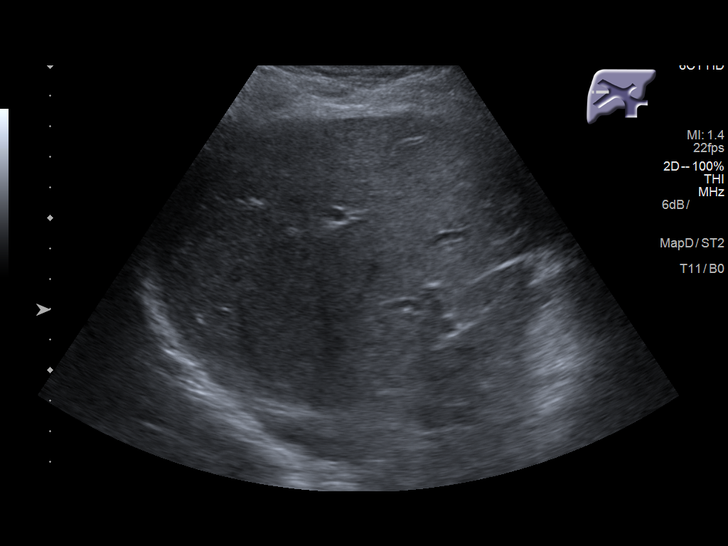
[im 40/44]
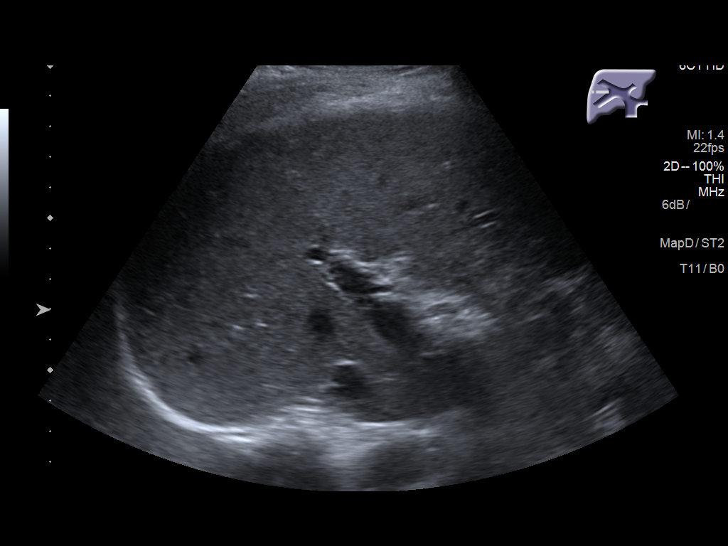
[im 44/44]
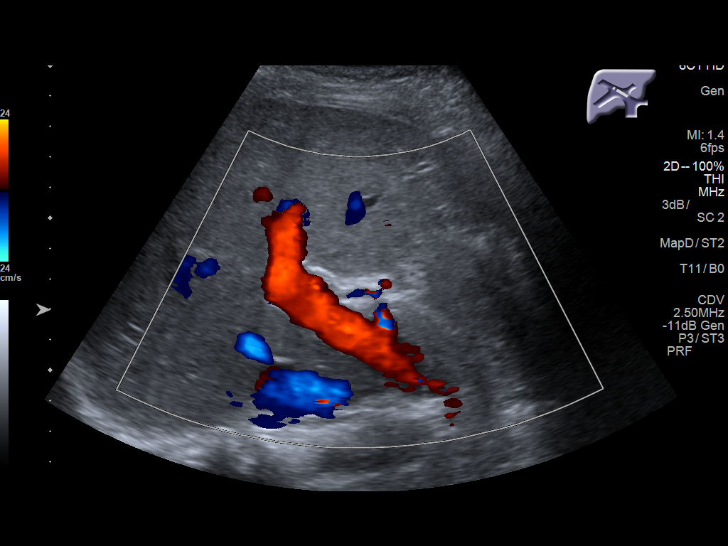

[14 of 25 positions shown; findings below may reference images not displayed]

FINDINGS: Gallbladder:

There are multiple gallstones within the gallbladder. The
gallbladder is partially contracted. The gallbladder wall measures
4.1 mm. No sonographic Murphy sign noted by sonographer.

Common bile duct:

Diameter: 2.6 mm

Liver:

No focal lesion identified. Within normal limits in parenchymal
echogenicity. Portal vein is patent on color Doppler imaging with
normal direction of blood flow towards the liver.
IMPRESSION: Cholelithiasis without sonographic evidence of acute cholecystitis.
Mild gallbladder wall thickening likely due to partial
decompression.
